# Patient Record
Sex: Female | Born: 1960 | Race: White | Hispanic: No | Smoking: Current every day smoker
Health system: Southern US, Community
[De-identification: ages and names within clinical notes are randomized; demographics above are authoritative.]

## PROBLEM LIST (undated history)

## (undated) DIAGNOSIS — R519 Headache, unspecified: Secondary | ICD-10-CM

## (undated) DIAGNOSIS — F419 Anxiety disorder, unspecified: Secondary | ICD-10-CM

## (undated) DIAGNOSIS — J90 Pleural effusion, not elsewhere classified: Secondary | ICD-10-CM

## (undated) DIAGNOSIS — K5790 Diverticulosis of intestine, part unspecified, without perforation or abscess without bleeding: Secondary | ICD-10-CM

## (undated) DIAGNOSIS — K219 Gastro-esophageal reflux disease without esophagitis: Secondary | ICD-10-CM

## (undated) DIAGNOSIS — E785 Hyperlipidemia, unspecified: Secondary | ICD-10-CM

## (undated) DIAGNOSIS — D649 Anemia, unspecified: Secondary | ICD-10-CM

## (undated) DIAGNOSIS — I517 Cardiomegaly: Secondary | ICD-10-CM

## (undated) DIAGNOSIS — R51 Headache: Secondary | ICD-10-CM

## (undated) DIAGNOSIS — K648 Other hemorrhoids: Secondary | ICD-10-CM

## (undated) HISTORY — DX: Diverticulosis of intestine, part unspecified, without perforation or abscess without bleeding: K57.90

## (undated) HISTORY — DX: Other hemorrhoids: K64.8

## (undated) HISTORY — DX: Cardiomegaly: I51.7

## (undated) HISTORY — DX: Anxiety disorder, unspecified: F41.9

## (undated) HISTORY — DX: Headache: R51

## (undated) HISTORY — DX: Gastro-esophageal reflux disease without esophagitis: K21.9

## (undated) HISTORY — DX: Hyperlipidemia, unspecified: E78.5

## (undated) HISTORY — PX: TONSILLECTOMY: SUR1361

## (undated) HISTORY — PX: COLONOSCOPY W/ POLYPECTOMY: SHX1380

## (undated) HISTORY — DX: Headache, unspecified: R51.9

## (undated) HISTORY — DX: Anemia, unspecified: D64.9

## (undated) HISTORY — DX: Pleural effusion, not elsewhere classified: J90

---

## 2018-01-03 ENCOUNTER — Encounter: Payer: Self-pay | Admitting: *Deleted

## 2018-01-03 DIAGNOSIS — I517 Cardiomegaly: Secondary | ICD-10-CM | POA: Insufficient documentation

## 2018-01-03 DIAGNOSIS — F419 Anxiety disorder, unspecified: Secondary | ICD-10-CM | POA: Insufficient documentation

## 2018-01-03 DIAGNOSIS — J90 Pleural effusion, not elsewhere classified: Secondary | ICD-10-CM

## 2018-01-03 HISTORY — DX: Pleural effusion, not elsewhere classified: J90

## 2018-01-03 HISTORY — DX: Cardiomegaly: I51.7

## 2018-01-06 ENCOUNTER — Encounter: Payer: Self-pay | Admitting: Cardiology

## 2018-01-06 ENCOUNTER — Ambulatory Visit (INDEPENDENT_AMBULATORY_CARE_PROVIDER_SITE_OTHER): Payer: BLUE CROSS/BLUE SHIELD | Admitting: Cardiology

## 2018-01-06 VITALS — BP 128/80 | HR 84 | Ht 65.0 in | Wt 172.1 lb

## 2018-01-06 DIAGNOSIS — F419 Anxiety disorder, unspecified: Secondary | ICD-10-CM | POA: Diagnosis not present

## 2018-01-06 DIAGNOSIS — F17210 Nicotine dependence, cigarettes, uncomplicated: Secondary | ICD-10-CM

## 2018-01-06 DIAGNOSIS — I517 Cardiomegaly: Secondary | ICD-10-CM

## 2018-01-06 DIAGNOSIS — R079 Chest pain, unspecified: Secondary | ICD-10-CM | POA: Insufficient documentation

## 2018-01-06 HISTORY — DX: Nicotine dependence, cigarettes, uncomplicated: F17.210

## 2018-01-06 HISTORY — DX: Chest pain, unspecified: R07.9

## 2018-01-06 NOTE — Addendum Note (Signed)
Addended by: Roosvelt HarpsHARRIS, Ashlin Hidalgo R on: 01/06/2018 10:54 AM   Modules accepted: Orders

## 2018-01-06 NOTE — Progress Notes (Signed)
Cardiology Office Note:    Date:  01/06/2018   ID:  Lydia Mitchell, DOB 01/28/1961, MRN [id number]  PCP:  Nonnie DoneSlatosky, Lydia J., MD  Cardiologist:  Garwin Brothersajan R Revankar, MD   Referring MD: Nonnie DoneSlatosky, Lydia J., MD    ASSESSMENT:    1. Chest pain, unspecified type   2. Anxiety   3. Cardiomegaly   4. Cigarette smoker    PLAN:    In order of problems listed above:  1. I discussed my findings with the patient at extensive length.  Primary prevention stressed.  Importance of compliance with diet and medications stressed and she vocalized understanding. 2. I spent 5 minutes with the patient discussing solely about smoking. Smoking cessation was counseled. I suggested to the patient also different medications and pharmacological interventions. Patient is keen to try stopping on its own at this time. He will get back to me if he needs any further assistance in this matter. 3. She does not clinically or from the EKG standpoint have evidence of pericarditis.  Her clinical exam did not reveal any pericardial rub or pleural rub.  In view of this I would not do any active intervention at this time from a cardiology standpoint. 4. Echocardiogram will be done to assess the extent of pericardial effusion.  She will undergo exercise stress Cardiolite to assess her chest pain symptoms which are atypical.  This is from a coronary standpoint. 5. Patient will be seen in follow-up appointment in 6 months or earlier if the patient has any concerns    Medication Adjustments/Labs and Tests Ordered: Current medicines are reviewed at length with the patient today.  Concerns regarding medicines are outlined above.  Orders Placed This Encounter  Procedures  . MYOCARDIAL PERFUSION IMAGING  . ECHOCARDIOGRAM COMPLETE   No orders of the defined types were placed in this encounter.    History of Present Illness:    Lydia Mitchell is a 57 y.o. female who is being seen today for the evaluation of pericardial effusion and  chest pain at the request of Lydia Mitchell, Excell SeltzerJohn J., MD.  Patient is a pleasant 57 year old female.  She has no significant past medical history.  She denies any history of upper respiratory tract infection.  She mentions to me that she had chest discomfort symptoms and went to her primary care doctor she was found to have a pleural and pericardial effusion.  These were tiny effusions.  She was sent here for that evaluation.  She tells me that she works at [employer].  She is walking around all day long with this she has no chest pain or chest tightness.  Deep breath or with changing of posture does not elicit her chest symptoms.  At the time of my evaluation, the patient is alert awake oriented and in no distress.  Past Medical History:  Diagnosis Date  . Acid reflux   . Anemia   . Anxiety   . Cardiomegaly 01/03/2018  . Cephalgia    Chronic recurrent  . Diverticulosis   . Internal hemorrhoids   . Pleural effusion 01/03/2018    Past Surgical History:  Procedure Laterality Date  . COLONOSCOPY W/ POLYPECTOMY    . TONSILLECTOMY      Current Medications: Current Meds  Medication Sig  . acetaminophen (TYLENOL) 325 MG tablet Take 2 tablets by mouth 3 (three) times daily.  Marland Kitchen. ALPRAZolam (XANAX) 0.25 MG tablet Take 1 tablet by mouth as needed.   Marland Kitchen. levofloxacin (LEVAQUIN) 500 MG tablet Take 1 tablet by  mouth daily.  . predniSONE (DELTASONE) 20 MG tablet Take 20 mg by mouth daily. 60/50/40/30/20/10 orally for each dosage     Allergies:   Patient has no known allergies.   Social History   Socioeconomic History  . Marital status: Unknown    Spouse name: Not on file  . Number of children: Not on file  . Years of education: Not on file  . Highest education level: Not on file  Occupational History  . Not on file  Social Needs  . Financial resource strain: Not on file  . Food insecurity:    Worry: Not on file    Inability: Not on file  . Transportation needs:    Medical: Not on file     Non-medical: Not on file  Tobacco Use  . Smoking status: Current Every Day Smoker  . Smokeless tobacco: Never Used  Substance and Sexual Activity  . Alcohol use: Never    Frequency: Never  . Drug use: Never  . Sexual activity: Not on file  Lifestyle  . Physical activity:    Days per week: Not on file    Minutes per session: Not on file  . Stress: Not on file  Relationships  . Social connections:    Talks on phone: Not on file    Gets together: Not on file    Attends religious service: Not on file    Active member of club or organization: Not on file    Attends meetings of clubs or organizations: Not on file    Relationship status: Not on file  Other Topics Concern  . Not on file  Social History Narrative  . Not on file     Family History: The patient's family history is not on file.  ROS:   Please see the history of present illness.    All other systems reviewed and are negative.  EKGs/Labs/Other Studies Reviewed:    The following studies were reviewed today: EKG done and sent to me revealed sinus rhythm and nonspecific ST-T changes.   Recent Labs: No results found for requested labs within last 8760 hours.  Recent Lipid Panel No results found for: CHOL, TRIG, HDL, CHOLHDL, VLDL, LDLCALC, LDLDIRECT  Physical Exam:    VS:  BP 128/80 (BP Location: Right Arm, Patient Position: Sitting, Cuff Size: Normal)   Pulse 84   Ht 5\' 5"  (1.651 m)   Wt 172 lb 1.9 oz (78.1 kg)   SpO2 98%   BMI 28.64 kg/m     Wt Readings from Last 3 Encounters:  01/06/18 172 lb 1.9 oz (78.1 kg)     GEN: Patient is in no acute distress HEENT: Normal NECK: No JVD; No carotid bruits LYMPHATICS: No lymphadenopathy CARDIAC: S1 S2 regular, 2/6 systolic murmur at the apex. RESPIRATORY:  Clear to auscultation without rales, wheezing or rhonchi  ABDOMEN: Soft, non-tender, non-distended MUSCULOSKELETAL:  No edema; No deformity  SKIN: Warm and dry NEUROLOGIC:  Alert and oriented x  3 PSYCHIATRIC:  Normal affect    Signed, Garwin Brothers, MD  01/06/2018 10:06 AM    [facility]

## 2018-01-06 NOTE — Patient Instructions (Signed)
Medication Instructions:  Your physician recommends that you continue on your current medications as directed. Please refer to the Current Medication list given to you today.  Labwork: None  Testing/Procedures: Your physician has requested that you have an echocardiogram. Echocardiography is a painless test that uses sound waves to create images of your heart. It provides your doctor with information about the size and shape of your heart and how well your heart's chambers and valves are working. This procedure takes approximately one hour. There are no restrictions for this procedure.  Your physician has requested that you have en exercise stress myoview. For further information please visit [email or web address]. Please follow instruction sheet, as given.  Follow-Up: Your physician recommends that you schedule a follow-up appointment in: 6 months  Any Other Special Instructions Will Be Listed Below (If Applicable).     If you need a refill on your cardiac medications before your next appointment, please call your pharmacy.   [facility]  Garey HamAshley A, RN, BSN  Echocardiogram An echocardiogram, or echocardiography, uses sound waves (ultrasound) to produce an image of your heart. The echocardiogram is simple, painless, obtained within a short period of time, and offers valuable information to your health care provider. The images from an echocardiogram can provide information such as:  Evidence of coronary artery disease (CAD).  Heart size.  Heart muscle function.  Heart valve function.  Aneurysm detection.  Evidence of a past heart attack.  Fluid buildup around the heart.  Heart muscle thickening.  Assess heart valve function.  Tell a health care provider about:  Any allergies you have.  All medicines you are taking, including vitamins, herbs, eye drops, creams, and over-the-counter medicines.  Any problems you or family members have had with anesthetic  medicines.  Any blood disorders you have.  Any surgeries you have had.  Any medical conditions you have.  Whether you are pregnant or may be pregnant. What happens before the procedure? No special preparation is needed. Eat and drink normally. What happens during the procedure?  In order to produce an image of your heart, gel will be applied to your chest and a wand-like tool (transducer) will be moved over your chest. The gel will help transmit the sound waves from the transducer. The sound waves will harmlessly bounce off your heart to allow the heart images to be captured in real-time motion. These images will then be recorded.  You may need an IV to receive a medicine that improves the quality of the pictures. What happens after the procedure? You may return to your normal schedule including diet, activities, and medicines, unless your health care provider tells you otherwise. This information is not intended to replace advice given to you by your health care provider. Make sure you discuss any questions you have with your health care provider. Document Released: 09/18/2000 Document Revised: 05/09/2016 Document Reviewed: 05/29/2013 Elsevier Interactive Patient Education  2017 [employer]

## 2018-01-07 ENCOUNTER — Ambulatory Visit (INDEPENDENT_AMBULATORY_CARE_PROVIDER_SITE_OTHER): Payer: BLUE CROSS/BLUE SHIELD | Admitting: Internal Medicine

## 2018-01-07 ENCOUNTER — Encounter: Payer: Self-pay | Admitting: Internal Medicine

## 2018-01-07 ENCOUNTER — Other Ambulatory Visit (INDEPENDENT_AMBULATORY_CARE_PROVIDER_SITE_OTHER): Payer: BLUE CROSS/BLUE SHIELD

## 2018-01-07 ENCOUNTER — Ambulatory Visit (INDEPENDENT_AMBULATORY_CARE_PROVIDER_SITE_OTHER)
Admission: RE | Admit: 2018-01-07 | Discharge: 2018-01-07 | Disposition: A | Discharge status: Home / Self Care | Payer: BLUE CROSS/BLUE SHIELD | Source: Ambulatory Visit | Attending: Internal Medicine | Admitting: Internal Medicine

## 2018-01-07 VITALS — BP 112/74 | HR 72 | Ht 65.0 in | Wt 168.6 lb

## 2018-01-07 DIAGNOSIS — R0609 Other forms of dyspnea: Secondary | ICD-10-CM

## 2018-01-07 DIAGNOSIS — F17210 Nicotine dependence, cigarettes, uncomplicated: Secondary | ICD-10-CM | POA: Diagnosis not present

## 2018-01-07 DIAGNOSIS — R0600 Dyspnea, unspecified: Secondary | ICD-10-CM

## 2018-01-07 DIAGNOSIS — J90 Pleural effusion, not elsewhere classified: Secondary | ICD-10-CM

## 2018-01-07 HISTORY — DX: Other forms of dyspnea: R06.09

## 2018-01-07 HISTORY — DX: Dyspnea, unspecified: R06.00

## 2018-01-07 LAB — CBC WITH DIFFERENTIAL/PLATELET
Basophils Absolute: 0.1 K/uL (ref 0.0–0.1)
Basophils Relative: 1 % (ref 0.0–3.0)
EOS ABS: 0.1 K/uL (ref 0.0–0.7)
EOS PCT: 0.7 % (ref 0.0–5.0)
HCT: 40.4 % (ref 36.0–46.0)
HEMOGLOBIN: 13.6 g/dL (ref 12.0–15.0)
LYMPHS ABS: 3.2 K/uL (ref 0.7–4.0)
Lymphocytes Relative: 22.1 % (ref 12.0–46.0)
MCHC: 33.7 g/dL (ref 30.0–36.0)
MCV: 94.3 fl (ref 78.0–100.0)
MONO ABS: 0.8 K/uL (ref 0.1–1.0)
Monocytes Relative: 5.6 % (ref 3.0–12.0)
NEUTROS PCT: 70.6 % (ref 43.0–77.0)
Neutro Abs: 10.2 K/uL — ABNORMAL HIGH (ref 1.4–7.7)
Platelets: 606 K/uL — ABNORMAL HIGH (ref 150.0–400.0)
RBC: 4.28 Mil/uL (ref 3.87–5.11)
RDW: 13.1 % (ref 11.5–15.5)
WBC: 14.5 K/uL — AB (ref 4.0–10.5)

## 2018-01-07 LAB — SEDIMENTATION RATE: SED RATE: 113 mm/hr — AB (ref 0–30)

## 2018-01-07 NOTE — Patient Instructions (Addendum)
Please remember to go to the lab and x-ray department downstairs in the basement  for your tests - we will call you with the results when they are available and decide on follow up   We will call you a year from your prior CT to be sure you get the follow up on the nodule that was detected but it has nothing to do with your present symptoms or the fluid issue which is likely related to your diverticulitis  The key is to stop smoking completely before smoking completely stops you - it's not too late! - ADD:;  Esr 113 so needs to taper off pred and f/u in 2 weeks, sooner if needed for possible L thoracentesis

## 2018-01-07 NOTE — Progress Notes (Signed)
Subjective:     Patient ID: Lydia Mitchell, female   DOB: 04/10/1961,     MRN: [id number]  HPI   [age] yowf active smoker with tendency to rhinitis/sneezing springtime but around 2016 developed  tendency to "bronchitis" esp in winter then Jan 2019 developed abrupt onset  n and v and diffuse abd pain > UC eval > [facility] 11/04/17 > dx signmoid diverticulitis rx abx and some better but pain localized to R flank did have gen pain 10/10 and down 2/10 and n and V gone but f/u  CT 12/24/17 diverticulitis bettter but now  :  L > R small effusions and  More doe but no rest/noct sob x 2 pillows   01/07/2018 1st [facility] office visit/ Wert   Chief Complaint  Patient presents with  . Pulmonary Consult    Referred by Dr. Egbert GaribaldiSlatosky for eval of pleural effusion.  She c/o SOB "when I walk alot" for the past 2 months. She has occ CP and prod cough with clear sputum.    cp is R ant chest x sev sec x once day never supine/ better also with R flank supine in supine position Eating and drinking better/ min am cough with mucoid sputum and doe = MMRC2 = can't walk a nl pace on a flat grade s sob but does fine slow and flat and able to sleep on 1-2 pillows   rx levaquin and pred by pcp 12/31/17>  No sure made any difference/ no using inhalers   No obvious day to day or daytime variability or assoc excess/ purulent sputum or mucus plugs or hemoptysis or chest tightness, subjective wheeze or overt sinus or hb symptoms. No unusual exposure hx or h/o childhood pna/ asthma or knowledge of premature birth.  Sleeping ok  2 pillows  without nocturnal  or early am exacerbation  of respiratory  c/o's or need for noct saba. Also denies any obvious fluctuation of symptoms with weather or environmental changes or other aggravating or alleviating factors except as outlined above   Current Allergies, Complete Past Medical History, Past Surgical History, Family History, and Social History were reviewed in [facility]  electronic medical record.  ROS  The following are not active complaints unless bolded Hoarseness, sore throat, dysphagia, dental problems, itching, sneezing,  nasal congestion or discharge of excess mucus or purulent secretions, ear ache,   fever, chills, sweats, unintended wt loss or wt gain, classically pleuritic or exertional cp,  orthopnea pnd or leg swelling, presyncope, palpitations, abdominal pain, anorexia, nausea, vomiting, diarrhea  or change in bowel habits or change in bladder habits, change in stools or change in urine, dysuria, hematuria,  rash, arthralgias, visual complaints, headache, numbness, weakness or ataxia or problems with walking or coordination,  change in mood/affect or memory.        Current Meds  Medication Sig  . acetaminophen (TYLENOL) 325 MG tablet Take 2 tablets by mouth 3 (three) times daily.  Marland Kitchen. ALPRAZolam (XANAX) 0.25 MG tablet Take 1 tablet by mouth as needed.   Marland Kitchen. levofloxacin (LEVAQUIN) 500 MG tablet Take 1 tablet by mouth daily.  . predniSONE (DELTASONE) 20 MG tablet Take 20 mg by mouth daily. 60/50/40/30/20/10 orally for each dosage                  Review of Systems     Objective:   Physical Exam    somber amb wf nad   Wt Readings from Last 3 Encounters:  01/07/18 168  lb 9.6 oz (76.5 kg)  01/06/18 172 lb 1.9 oz (78.1 kg)     Vital signs reviewed - Note on arrival 02 sats  99% on RA      HEENT: nl dentition, turbinates bilaterally, and oropharynx. Nl external ear canals without cough reflex   NECK :  without JVD/Nodes/TM/ nl carotid upstrokes bilaterally   LUNGS: no acc muscle use,  Nl contour chest with minimal decrease bs L > R base and dullness    CV:  RRR  no s3 or murmur or increase in P2, and no edema   ABD:  Obese/ soft and nontender with nl inspiratory excursion in the supine position. No bruits or organomegaly appreciated, bowel sounds nl  MS:  Nl gait/ ext warm without deformities, calf tenderness, cyanosis or  clubbing No obvious joint restrictions   SKIN: warm and dry without lesions    NEURO:  alert, approp, nl sensorium with  no motor or cerebellar deficits apparent.       CXR PA and Lateral:   01/07/2018 :    I personally reviewed images and agree with radiology impression as follows:    small L effusion s miniscus sign on lateral so likely loculated       Lab Results  Component Value Date   ESRSEDRATE 113 (H) 01/07/2018     Lab Results  Component Value Date   WBC 14.5 (H) 01/07/2018   HGB 13.6 01/07/2018   HCT 40.4 01/07/2018   MCV 94.3 01/07/2018   PLT 606.0 (H) 01/07/2018     Assessment:

## 2018-01-08 ENCOUNTER — Encounter: Payer: Self-pay | Admitting: Internal Medicine

## 2018-01-08 NOTE — Assessment & Plan Note (Signed)
Spirometry 01/07/2018  FEV1 1.85 (68%)  Ratio 83 w/o curvature on no rx    Mostly restrictive prob related to body habitus and L effusion (see separate a/p)  No need for bronchodilators here but needs to stop smoking now (see separate a/p)

## 2018-01-08 NOTE — Assessment & Plan Note (Signed)
See CT chest 12/24/17 at [facility] - ESR  113 01/07/2018 on prednisone taper   Most likely this is a "sympathetic" effusion related to diverticulitis or parapneumonic process from vomiting/ asp injury from the initial acute abdominal issues but either way it is now loculated and may well not be drainable with standard thoracentesis. Also has very min pericardial fluid raising the possibility of periccarditis/ pleuritis from collagen vasc dz but this is much less likely  Since she appears to be doing overall well I rec taper off pred and if any worsening of effusion then attempt tap under u/s guidance.   Will need close f/u either way = 2 weeks    Total time devoted to counseling  > 50 % of initial 60 min office visit:  review case with pt/ discussion of options/alternatives/ personally creating written customized instructions  in presence of pt  then going over those specific  Instructions directly with the pt including how to use all of the meds but in particular covering each new medication in detail and the difference between the maintenance= "automatic" meds and the prns using an action plan format for the latter (If this problem/symptom => do that organization reading Left to right).  Please see AVS from this visit for a full list of these instructions which I personally wrote for this pt and  are unique to this visit.

## 2018-01-08 NOTE — Assessment & Plan Note (Signed)
No evidence at all of copd here ' > 3 min discussion I reviewed the Fletcher curve with the patient that basically indicates  if you quit smoking when your best day FEV1 is still well preserved (as is clearly  the case here)  it is highly unlikely you will progress to severe disease and informed the patient there was  no medication on the market that has proven to alter the curve/ its downward trajectory  or the likelihood of progression of their disease(unlike other chronic medical conditions such as atheroclerosis where we do think we can change the natural hx with risk reducing meds)    Therefore stopping smoking and maintaining abstinence are  the most important aspects of care, not choice of inhalers or for that matter, doctors.   Treatment other than smoking cessation  is entirely directed by severity of symptoms and focused also on reducing exacerbations, not attempting to change the natural history of the disease.

## 2018-01-10 NOTE — Progress Notes (Signed)
Spoke with pt and notified of results per Dr. Wert. Pt verbalized understanding and denied any questions. 

## 2018-01-12 DIAGNOSIS — R079 Chest pain, unspecified: Secondary | ICD-10-CM

## 2018-01-13 ENCOUNTER — Other Ambulatory Visit: Payer: Self-pay

## 2018-01-13 ENCOUNTER — Telehealth: Payer: Self-pay

## 2018-01-13 DIAGNOSIS — R079 Chest pain, unspecified: Secondary | ICD-10-CM

## 2018-01-13 DIAGNOSIS — I517 Cardiomegaly: Secondary | ICD-10-CM

## 2018-01-13 NOTE — Telephone Encounter (Signed)
Results from echo and lexi given to the patient. No further questions or concerns.

## 2018-01-19 ENCOUNTER — Ambulatory Visit (INDEPENDENT_AMBULATORY_CARE_PROVIDER_SITE_OTHER): Payer: BLUE CROSS/BLUE SHIELD | Admitting: Internal Medicine

## 2018-01-19 ENCOUNTER — Encounter: Payer: Self-pay | Admitting: Internal Medicine

## 2018-01-19 ENCOUNTER — Other Ambulatory Visit (INDEPENDENT_AMBULATORY_CARE_PROVIDER_SITE_OTHER): Payer: BLUE CROSS/BLUE SHIELD

## 2018-01-19 ENCOUNTER — Ambulatory Visit (INDEPENDENT_AMBULATORY_CARE_PROVIDER_SITE_OTHER)
Admission: RE | Admit: 2018-01-19 | Discharge: 2018-01-19 | Disposition: A | Discharge status: Home / Self Care | Payer: BLUE CROSS/BLUE SHIELD | Source: Ambulatory Visit | Attending: Internal Medicine | Admitting: Internal Medicine

## 2018-01-19 ENCOUNTER — Other Ambulatory Visit: Payer: Self-pay | Admitting: Internal Medicine

## 2018-01-19 VITALS — BP 104/64 | HR 83 | Ht 65.0 in | Wt 170.6 lb

## 2018-01-19 DIAGNOSIS — J90 Pleural effusion, not elsewhere classified: Secondary | ICD-10-CM

## 2018-01-19 DIAGNOSIS — R0609 Other forms of dyspnea: Secondary | ICD-10-CM | POA: Diagnosis not present

## 2018-01-19 DIAGNOSIS — F17210 Nicotine dependence, cigarettes, uncomplicated: Secondary | ICD-10-CM

## 2018-01-19 DIAGNOSIS — R911 Solitary pulmonary nodule: Secondary | ICD-10-CM

## 2018-01-19 HISTORY — DX: Solitary pulmonary nodule: R91.1

## 2018-01-19 LAB — CBC WITH DIFFERENTIAL/PLATELET
BASOS PCT: 0.4 % (ref 0.0–3.0)
Basophils Absolute: 0 K/uL (ref 0.0–0.1)
Eosinophils Absolute: 0.5 K/uL (ref 0.0–0.7)
Eosinophils Relative: 5 % (ref 0.0–5.0)
HEMATOCRIT: 42.1 % (ref 36.0–46.0)
Hemoglobin: 13.9 g/dL (ref 12.0–15.0)
LYMPHS ABS: 2.9 K/uL (ref 0.7–4.0)
LYMPHS PCT: 26.4 % (ref 12.0–46.0)
MCHC: 33.1 g/dL (ref 30.0–36.0)
MCV: 95.1 fl (ref 78.0–100.0)
Monocytes Absolute: 0.8 K/uL (ref 0.1–1.0)
Monocytes Relative: 7.7 % (ref 3.0–12.0)
NEUTROS ABS: 6.6 K/uL (ref 1.4–7.7)
NEUTROS PCT: 60.5 % (ref 43.0–77.0)
PLATELETS: 391 K/uL (ref 150.0–400.0)
RBC: 4.42 Mil/uL (ref 3.87–5.11)
RDW: 13.5 % (ref 11.5–15.5)
WBC: 11 K/uL — ABNORMAL HIGH (ref 4.0–10.5)

## 2018-01-19 LAB — SEDIMENTATION RATE: Sed Rate: 25 mm/hr (ref 0–30)

## 2018-01-19 NOTE — Patient Instructions (Signed)
Please remember to go to the lab department downstairs in the basement  for your tests - we will call you with the results when they are available.  The key is to stop smoking completely before smoking completely stops you!    Pulmonary follow up is as needed

## 2018-01-19 NOTE — Progress Notes (Signed)
Subjective:     Patient ID: Lydia Mitchell, female   DOB: 1961/02/03,     MRN: [id number]     Brief patient profile:  4 yowf active smoker with tendency to rhinitis/sneezing springtime but around 2016 developed  tendency to "bronchitis" esp in winter then Jan 2019 developed abrupt onset  n and v and diffuse abd pain > UC eval > [facility] 11/04/17 > dx sigmoid diverticulitis rx abx and some better but pain localized to R flank did have gen pain 10/10 and down 2/10 and n and V gone but f/u  CT 12/24/17 diverticulitis bettter but now  :  L > R small effusions and  More doe but no rest/noct sob x 2 pillows   01/07/2018 1st  Pulmonary office visit/ Amahd Morino   Chief Complaint  Patient presents with  . Pulmonary Consult    Referred by Dr. Wendie Agreste for eval of pleural effusion.  She c/o SOB "when I walk alot" for the past 2 months. She has occ CP and prod cough with clear sputum.    cp is R ant chest x sev sec x once day never supine/ better also with R flank supine in supine position Eating and drinking better/ min am cough with mucoid sputum and doe = MMRC2 = can't walk a nl pace on a flat grade s sob but does fine slow and flat and able to sleep on 1-2 pillows  rx levaquin and pred by pcp 12/31/17>  No sure made any difference/ no using inhalers rec Please remember to go to the lab and x-ray department downstairs in the basement  for your tests - we will call you with the results when they are available and decide on follow up We will call you a year from your prior CT to be sure you get the follow up on the nodule that was detected but it has nothing to do with your present symptoms or the fluid issue which is likely related to your diverticulitis The key is to stop smoking completely before smoking completely stops you - it's not too late! - ADD:;  Esr 113 so needs to taper off pred and f/u in 2 weeks, sooner if needed for possible L thoracentesis     01/19/2018  f/u ov/Dak Szumski re:  Chief  Complaint  Patient presents with  . Follow-up    Breathing is unchanged.   Dyspnea:  MMRC1 = can walk nl pace, flat grade, can't hurry or go uphills or steps s sob   Cough: none Sleep: ok/ pains better supine  SABA use:  None New flank pain on L just like use to have on R, better supine, not worse with breathing or coughing / assoc with constipation and bloating   No obvious day to day or daytime variability or assoc excess/ purulent sputum or mucus plugs or hemoptysis or cp or chest tightness, subjective wheeze or overt sinus or hb symptoms. No unusual exposure hx or h/o childhood pna/ asthma or knowledge of premature birth.  Sleeping  Ok   without nocturnal  or early am exacerbation  of respiratory  c/o's or need for noct saba. Also denies any obvious fluctuation of symptoms with weather or environmental changes or other aggravating or alleviating factors except as outlined above   Current Allergies, Complete Past Medical History, Past Surgical History, Family History, and Social History were reviewed in [employer] record.  ROS  The following are not active complaints unless bolded Hoarseness, sore throat,  dysphagia, dental problems, itching, sneezing,  nasal congestion or discharge of excess mucus or purulent secretions, ear ache,   fever, chills, sweats, unintended wt loss or wt gain, classically pleuritic or exertional cp,  orthopnea pnd or arm/hand swelling  or leg swelling, presyncope, palpitations, abdominal pain, anorexia, nausea, vomiting, diarrhea  or change in bowel habits or change in bladder habits, change in stools or change in urine, dysuria, hematuria,  rash, arthralgias, visual complaints, headache, numbness, weakness or ataxia or problems with walking or coordination,  change in mood or  memory.        Current Meds  Medication Sig  . acetaminophen (TYLENOL) 325 MG tablet Take 2 tablets by mouth 3 (three) times daily.  Marland Kitchen ALPRAZolam (XANAX) 0.25 MG  tablet Take 1 tablet by mouth as needed.                  Objective:   Physical Exam  amb wf nad     01/19/2018      170  01/07/18 168 lb 9.6 oz (76.5 kg)  01/06/18 172 lb 1.9 oz (78.1 kg)      Vital signs reviewed - Note on arrival 02 sats  98% on RA    HEENT: nl dentition, turbinates bilaterally, and oropharynx. Nl external ear canals without cough reflex   NECK :  without JVD/Nodes/TM/ nl carotid upstrokes bilaterally   LUNGS: no acc muscle use,  Nl contour chest which is clear to A and P bilaterally without cough on insp or exp maneuvers   CV:  RRR  no s3 or murmur or increase in P2, and no edema   ABD: mod obese  soft and nontender with limited inspiratory excursion in the supine position. No bruits or organomegaly appreciated, bowel sounds nl  MS:  Nl gait/ ext warm without deformities, calf tenderness, cyanosis or clubbing No obvious joint restrictions   SKIN: warm and dry without lesions    NEURO:  alert, approp, nl sensorium with  no motor or cerebellar deficits apparent.        CXR PA and Lateral:   01/19/2018 :    I personally reviewed images and agree with radiology impression as follows:   Resolution of previous left pleural effusion. No active lung disease.        Assessment:

## 2018-01-19 NOTE — Assessment & Plan Note (Signed)
Spirometry 01/07/2018  FEV1 1.85 (68%)  Ratio 83 w/o curvature on no rx   Improved s specific rx , likely mild restrictive changes due to obesity with also significant deconditioning   No pulmonary f/u needed for this problem

## 2018-01-19 NOTE — Assessment & Plan Note (Signed)
>   3 min   I took an extended  opportunity with this patient to outline the consequences of continued cigarette use  in airway disorders based on all the data we have from the multiple national lung health studies (perfomed over decades at millions of dollars in cost)  indicating that smoking cessation, not choice of inhalers or physicians, is the most important aspect of care.    Already cut down to 4 cigs per day > advised complete smoking cessation is the goal > Follow up per Primary Care planned

## 2018-01-19 NOTE — Assessment & Plan Note (Signed)
CT abd 12/28/17 c/w 3 mm RML nodule > in computer for recall 12/29/18   CT results reviewed with pt >>> Too small for PET or bx, not suspicious enough for excisional bx > really only option for now is follow the Fleischner society guidelines as rec by radiology.   Discussed in detail all the  indications, usual  risks and alternatives  relative to the benefits with patient who agrees to proceed with conservative f/u as outlined = f/u in 12/2018 with ct chest if not done in [place]

## 2018-01-19 NOTE — Assessment & Plan Note (Signed)
Improved c/w sympathetic effusion related to ? abd infection/ diverticulitis > no f/u needed

## 2018-01-20 NOTE — Progress Notes (Signed)
LMTCB

## 2018-03-17 ENCOUNTER — Encounter: Payer: Self-pay | Admitting: Internal Medicine

## 2018-05-20 IMAGING — DX DG CHEST 2V
2 series · 2 of 2 positions shown · non-contrast
Comparison: None.

CLINICAL DATA: Dyspnea on exertion. History of cardiomegaly and
pleural effusion.

EXAM:
CHEST - 2 VIEW

[chest pa]
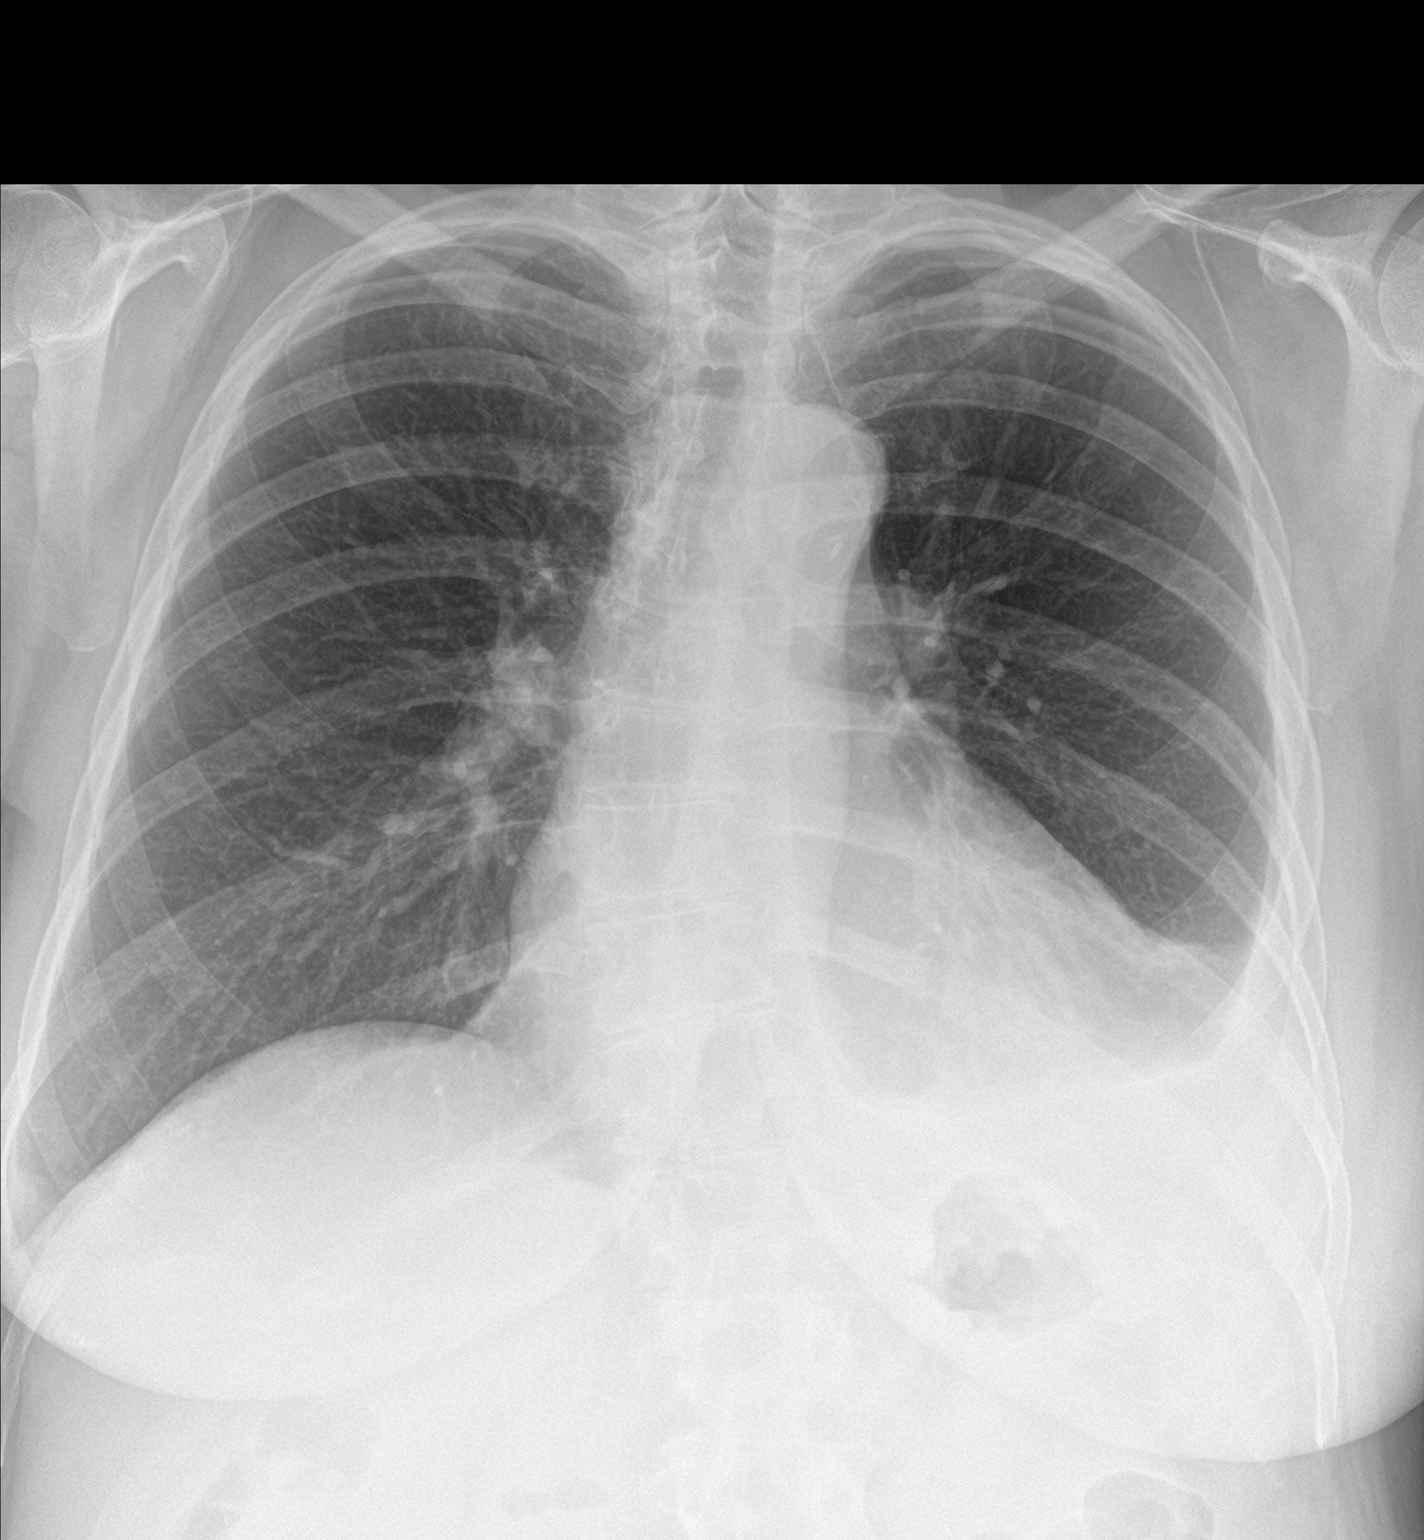

[chest lat]
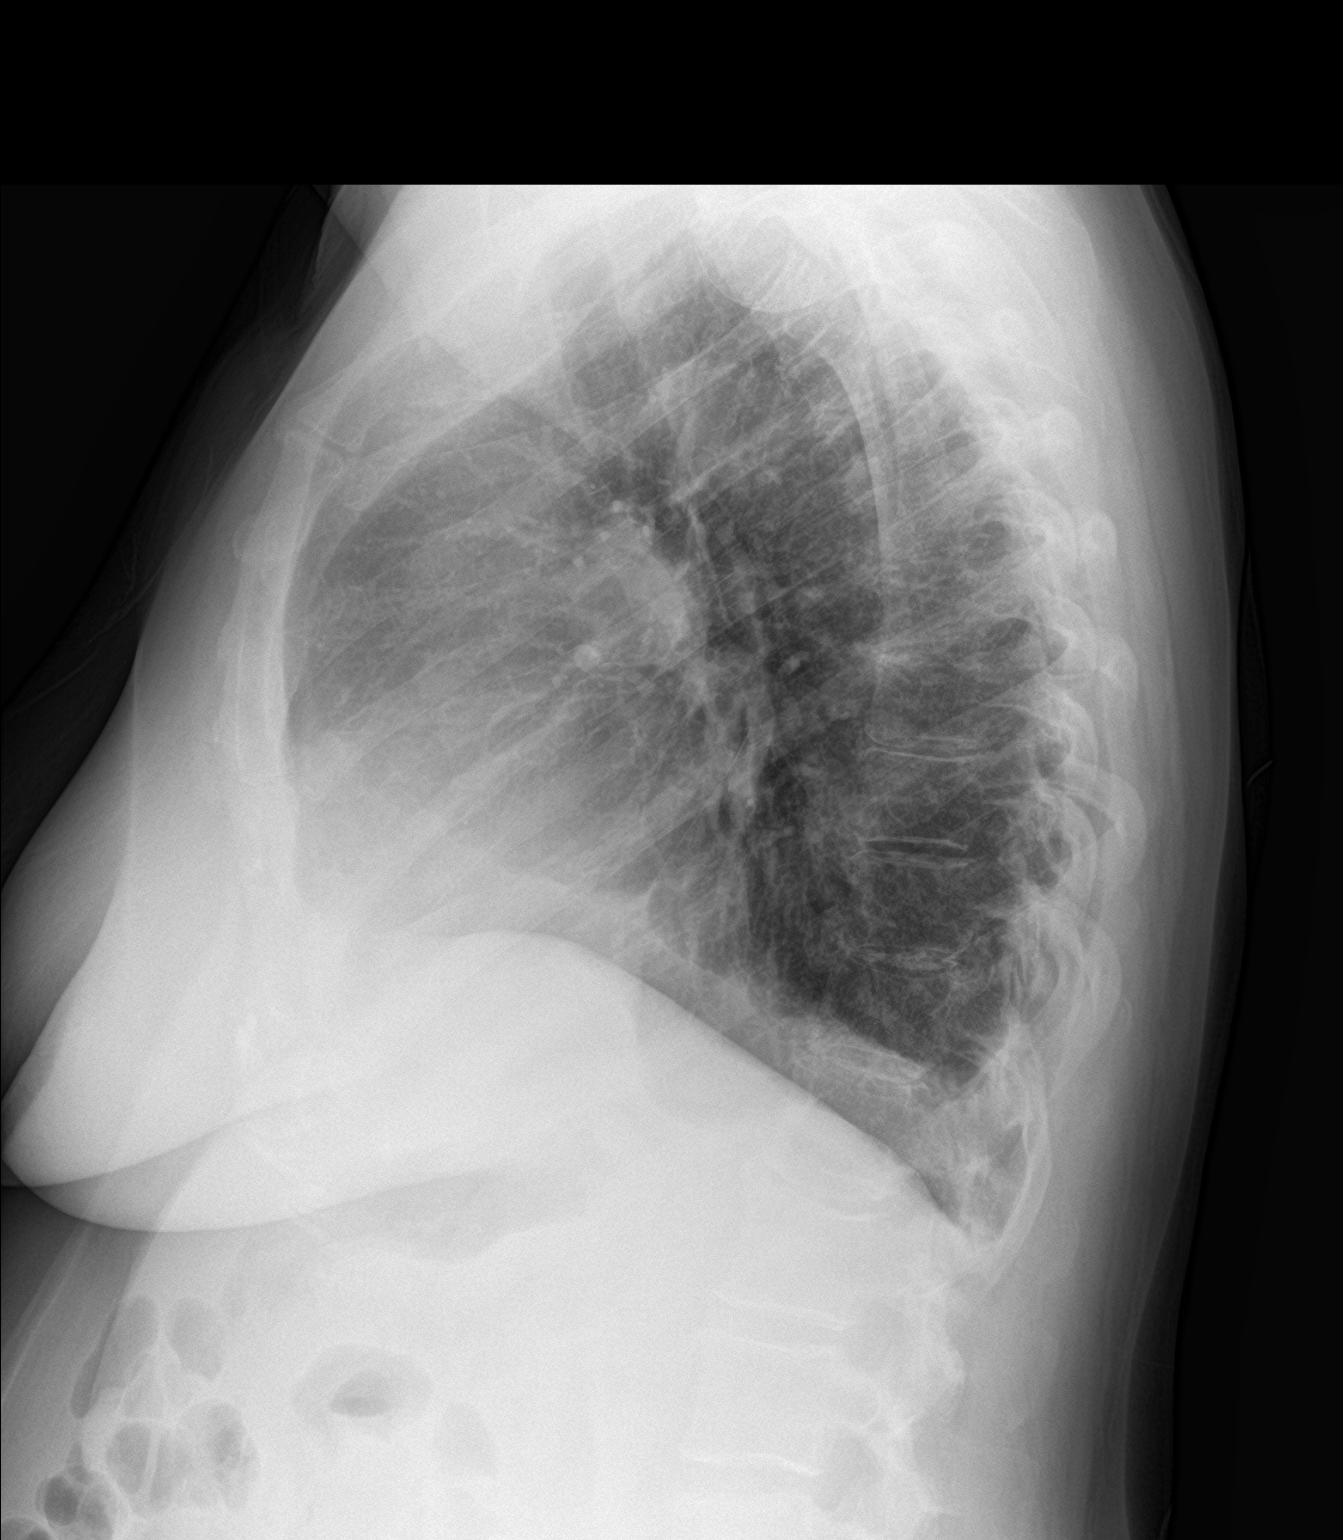

[2 of 2 positions shown; findings below may reference images not displayed]

FINDINGS: There is a small left pleural effusion with underlying opacity. A
tiny right effusion is identified on the lateral view. No
pneumothorax. The heart size is borderline to mildly enlarged.
Otherwise, the heart, hila, and mediastinum are unremarkable. No
other acute abnormalities.
IMPRESSION: Small left pleural effusion with underlying atelectasis.

## 2018-06-01 IMAGING — DX DG CHEST 2V
2 series · 2 of 2 positions shown · non-contrast
Comparison: Chest x-ray of 01/07/2018

CLINICAL DATA: History of left pleural effusion, follow-up,
persistent cough, smoking history

EXAM:
CHEST - 2 VIEW

[chest pa]
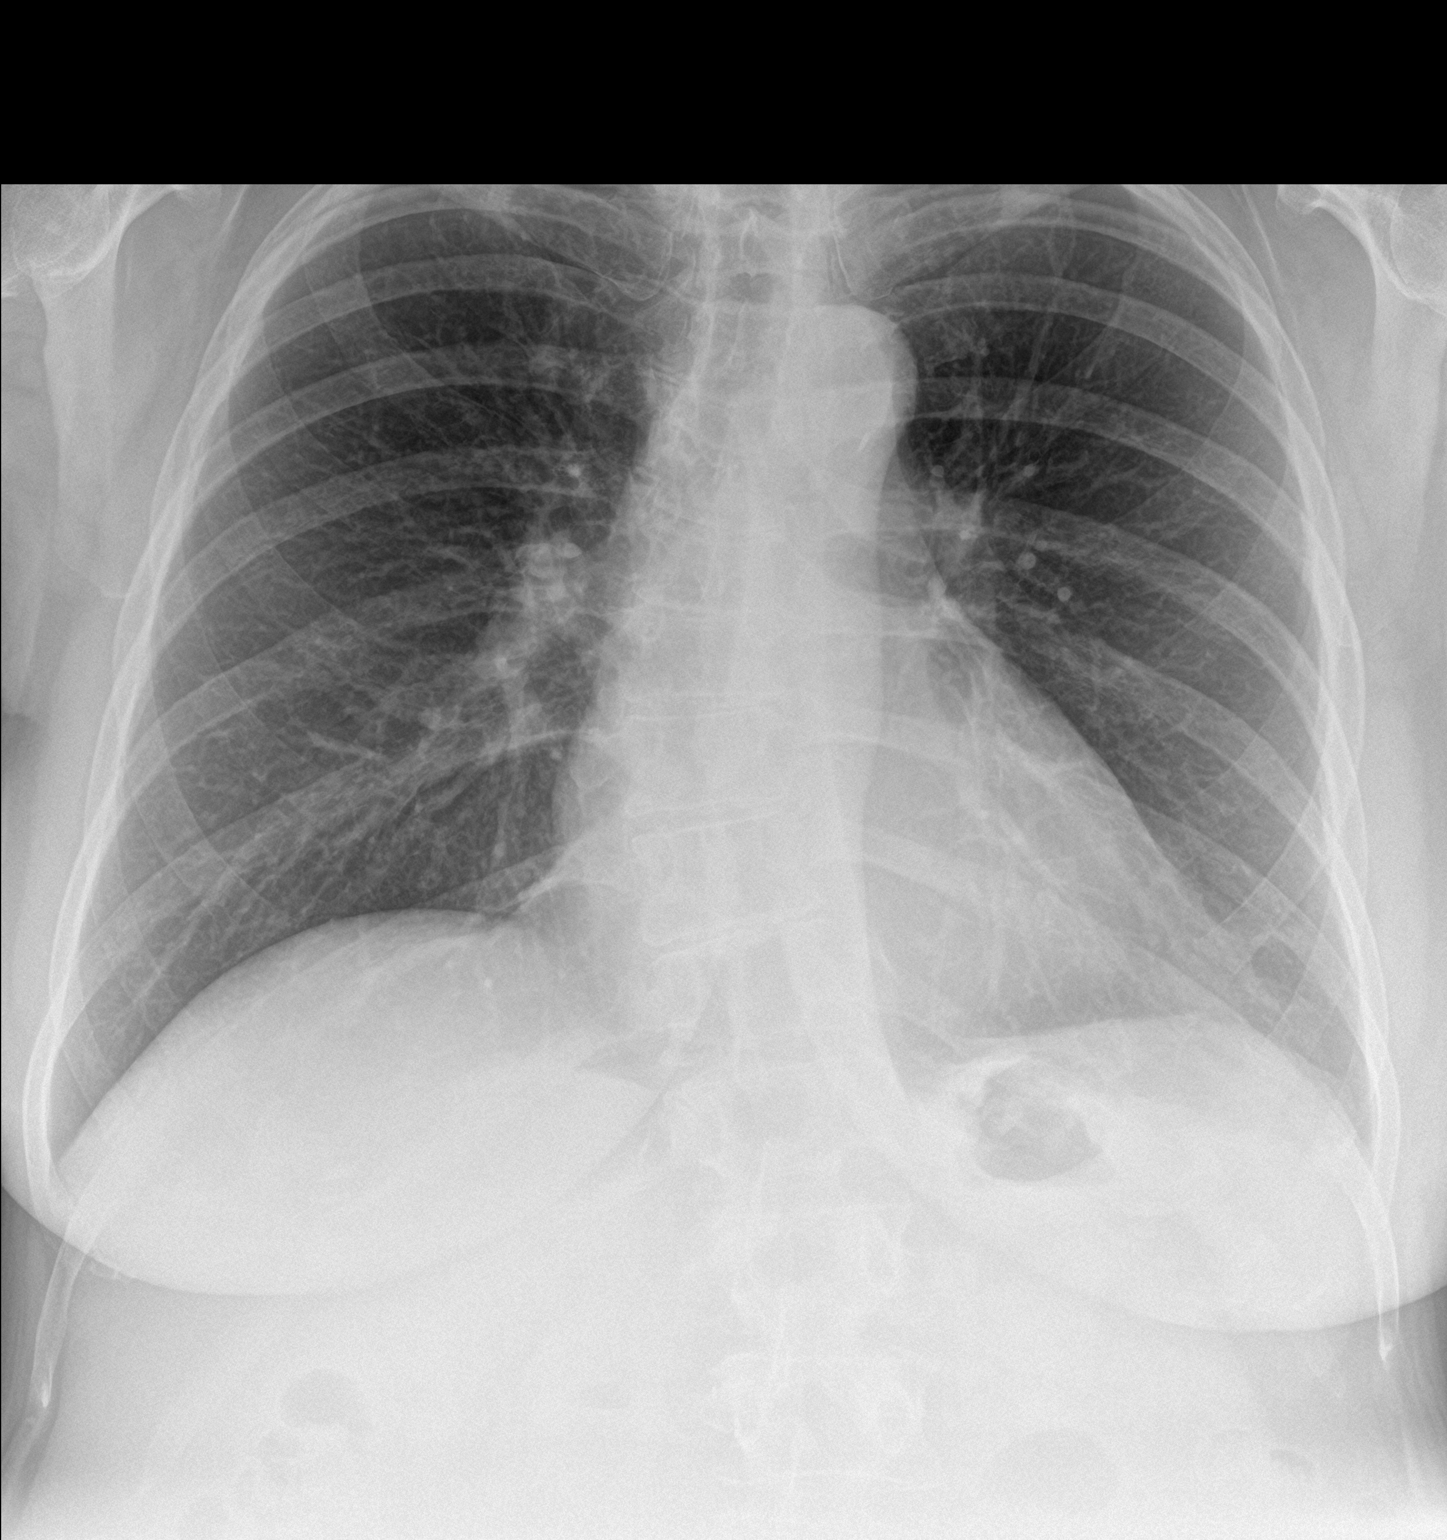

[chest lat]
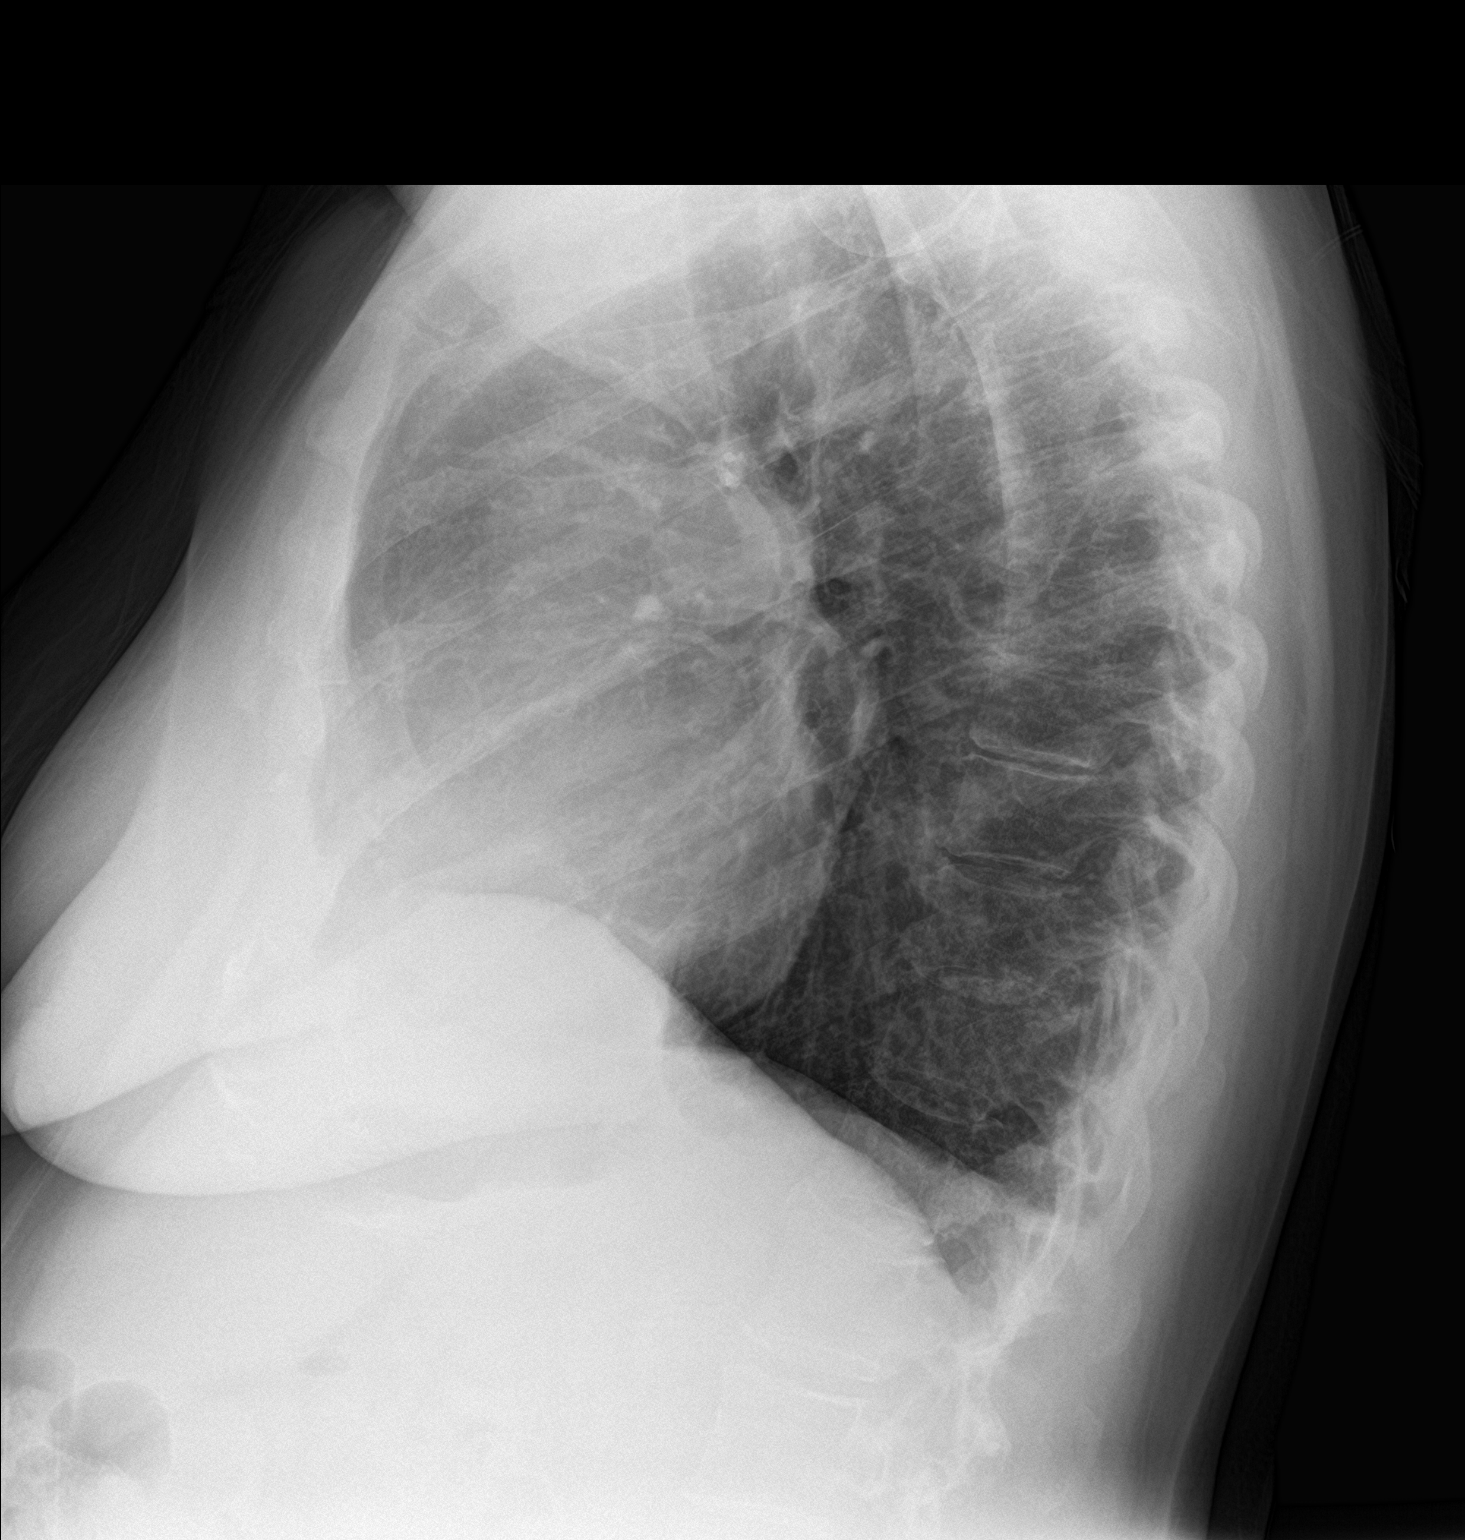

[2 of 2 positions shown; findings below may reference images not displayed]

FINDINGS: The left pleural effusion has resolved. No pneumonia is seen.
Mediastinal and hilar contours are unremarkable. The heart is mildly
enlarged and stable. No bony abnormality is seen.
IMPRESSION: Resolution of previous left pleural effusion. No active lung
disease.

## 2018-11-02 ENCOUNTER — Telehealth: Payer: Self-pay | Admitting: *Deleted

## 2018-11-02 NOTE — Telephone Encounter (Signed)
LMTCB  Details Code: R91.1 Noted: 01/19/2018 Share w/ Pt: [x]  Change Dx Resolve      Overview Edited: Nyoka Cowden, MD 01/19/2018 CT abd 12/28/17 c/w 3 mm RML nodule > in computer for recall 12/29/18

## 2018-11-02 NOTE — Telephone Encounter (Signed)
-----   Message from Nyoka Cowden, MD sent at 01/19/2018  1:51 PM EDT ----- Be sure has had ct s contrast f/u spn

## 2018-11-04 NOTE — Telephone Encounter (Signed)
LMTCB

## 2018-11-08 NOTE — Telephone Encounter (Signed)
Left message for patient to call back  

## 2018-11-10 ENCOUNTER — Encounter: Payer: Self-pay | Admitting: *Deleted

## 2018-11-10 NOTE — Telephone Encounter (Signed)
Letter mailed

## 2019-03-19 DIAGNOSIS — I340 Nonrheumatic mitral (valve) insufficiency: Secondary | ICD-10-CM | POA: Diagnosis not present

## 2019-09-08 ENCOUNTER — Other Ambulatory Visit: Payer: Self-pay | Admitting: Orthopedic Surgery

## 2019-09-08 DIAGNOSIS — M25561 Pain in right knee: Secondary | ICD-10-CM

## 2020-06-07 DIAGNOSIS — K219 Gastro-esophageal reflux disease without esophagitis: Secondary | ICD-10-CM | POA: Insufficient documentation

## 2020-06-07 DIAGNOSIS — D649 Anemia, unspecified: Secondary | ICD-10-CM | POA: Insufficient documentation

## 2020-06-07 DIAGNOSIS — F419 Anxiety disorder, unspecified: Secondary | ICD-10-CM | POA: Insufficient documentation

## 2020-06-07 DIAGNOSIS — K5790 Diverticulosis of intestine, part unspecified, without perforation or abscess without bleeding: Secondary | ICD-10-CM | POA: Insufficient documentation

## 2020-06-07 DIAGNOSIS — K648 Other hemorrhoids: Secondary | ICD-10-CM | POA: Insufficient documentation

## 2020-06-07 DIAGNOSIS — R519 Headache, unspecified: Secondary | ICD-10-CM | POA: Insufficient documentation

## 2020-06-11 ENCOUNTER — Encounter: Payer: Self-pay | Admitting: Cardiology

## 2020-06-12 ENCOUNTER — Encounter: Payer: Self-pay | Admitting: Cardiology

## 2020-06-12 ENCOUNTER — Other Ambulatory Visit: Payer: Self-pay

## 2020-06-12 ENCOUNTER — Ambulatory Visit: Payer: BC Managed Care – PPO | Admitting: Cardiology

## 2020-06-12 VITALS — BP 118/78 | HR 68 | Ht 65.0 in | Wt 189.4 lb

## 2020-06-12 DIAGNOSIS — F17210 Nicotine dependence, cigarettes, uncomplicated: Secondary | ICD-10-CM

## 2020-06-12 DIAGNOSIS — R0789 Other chest pain: Secondary | ICD-10-CM

## 2020-06-12 DIAGNOSIS — I10 Essential (primary) hypertension: Secondary | ICD-10-CM

## 2020-06-12 DIAGNOSIS — R0609 Other forms of dyspnea: Secondary | ICD-10-CM

## 2020-06-12 DIAGNOSIS — E782 Mixed hyperlipidemia: Secondary | ICD-10-CM

## 2020-06-12 DIAGNOSIS — R0600 Dyspnea, unspecified: Secondary | ICD-10-CM

## 2020-06-12 HISTORY — DX: Mixed hyperlipidemia: E78.2

## 2020-06-12 HISTORY — DX: Essential (primary) hypertension: I10

## 2020-06-12 HISTORY — DX: Other chest pain: R07.89

## 2020-06-12 MED ORDER — NITROGLYCERIN 0.4 MG SL SUBL
0.4000 mg | SUBLINGUAL | 3 refills | Status: AC | PRN
Start: 2020-06-12 — End: 2020-09-10

## 2020-06-12 NOTE — Patient Instructions (Signed)
Medication Instructions:  Your physician has recommended you make the following change in your medication:   1.    If a single episode of chest pain is not relieved by one tablet, the patient will try another within 5 minutes; and if this doesn't relieve the pain, the patient will try another within 5 minutes and if this doesn't relieve the pain the patient is instructed to call 911 for transportation to an emergency department. This was sent into your requested pharmacy.     *If you need a refill on your cardiac medications before your next appointment, please call your pharmacy*   Lab Work: None If you have labs (blood work) drawn today and your tests are completely normal, you will receive your results only by: Marland Kitchen MyChart Message (if you have MyChart) OR . A paper copy in the mail If you have any lab test that is abnormal or we need to change your treatment, we will call you to review the results.   Testing/Procedures: Your physician has requested that you have a lexiscan myoview. For further information please visit [email or web address] Please follow instruction sheet, as given.  You are scheduled for a Myocardial Perfusion Imaging Study on       at         .   Please arrive 15 minutes prior to your appointment time for registration and insurance purposes.   The test will take approximately 3 to 4 hours to complete; you may bring reading material. If someone comes with you to your appointment, they will need to remain in the main lobby due to limited space in the testing area.   If you are pregnant or breastfeeding, please notify the nuclear lab prior to your appointment.   How to prepare for your Myocardial Perfusion test:   Do not eat or drink 3 hours prior to your test, except you may have water.    Do not consume products containing caffeine (regular or decaffeinated) 12 hours prior to your test (ex: coffee, chocolate, soda, tea)   Do bring a list of your current medications  with you. If not listed below, you may take your medications as normal.    Bring any held medication to your appointment, as you may be required to take it once the test is complete.   Do wear comfortable clothes (no dresses or overalls) and walking shoes. Tennis shoes are preferred. No heels or open toed shoes.  Do not wear cologne, perfume, aftershave or lotions (deodorant is allowed).   If these instructions are not followed, you test will have to be rescheduled.   Please report to [address] for your test. If you have questions or concerns about your appointment, please call [phone].   If you cannot keep your appointment, please provide 24 hour notification to the Nuclear lab to avoid a possible $50 charge to your account.        Follow-Up: At [facility], you and your health needs are our priority.  As part of our continuing mission to provide you with exceptional heart care, we have created designated Provider Care Teams.  These Care Teams include your primary Cardiologist (physician) and Advanced Practice Providers (APPs -  Physician Assistants and Nurse Practitioners) who all work together to provide you with the care you need, when you need it.  We recommend signing up for the patient portal called "MyChart".  Sign up information is provided on this After Visit Summary.  MyChart is used to  connect with patients for Virtual Visits (Telemedicine).  Patients are able to view lab/test results, encounter notes, upcoming appointments, etc.  Non-urgent messages can be sent to your provider as well.   To learn more about what you can do with MyChart, go to [email or web address].    Your next appointment:   3 month(s)  The format for your next appointment:   In Person  Provider:   Belva Crome, MD   Other Instructions

## 2020-06-12 NOTE — Progress Notes (Signed)
Cardiology Office Note:    Date:  06/12/2020   ID:  Lydia Mitchell, DOB [id number], MRN [id number]  PCP:  Nonnie Done., MD  Cardiologist:  Garwin Brothers, MD   Referring MD: Nonnie Done., MD    ASSESSMENT:    1. Cigarette smoker   2. Chest tightness   3. Essential hypertension   4. Mixed dyslipidemia   5. Dyspnea on exertion    PLAN:    In order of problems listed above:  1. Primary prevention stressed to the patient.  Importance of compliance with diet medication stressed and she vocalized understanding.  Importance of regular exercise stressed but I told her that we would do the stress test first before she starts an exercise program and she understands. 2. Chest tightness: Patient has multiple risk factors for coronary artery disease and therefore will undergo Lexiscan sestamibi to evaluate for any obstructive evidence of coronary artery disease. 3. Essential hypertension: Blood pressure stable and diet was emphasized 4. Mixed dyslipidemia: Diet was emphasized.  She is on statin therapy.  Disease followed by her primary care physician. 5. Cigarette smoker: I spent 5 minutes with the patient discussing solely about smoking. Smoking cessation was counseled. I suggested to the patient also different medications and pharmacological interventions. Patient is keen to try stopping on its own at this time. He will get back to me if he needs any further assistance in this matter. 6. Sublingual nitroglycerin prescription was sent, its protocol and 911 protocol explained and the patient vocalized understanding questions were answered to the patient's satisfaction 7. She will be seen in follow-up appointment in 2 months or earlier if she has any concerns.  She knows to go to the nearest emergency room for any significant issues.   Medication Adjustments/Labs and Tests Ordered: Current medicines are reviewed at length with the patient today.  Concerns regarding medicines are outlined  above.  No orders of the defined types were placed in this encounter.  No orders of the defined types were placed in this encounter.    History of Present Illness:    Lydia Mitchell is a 59 y.o. female who is being seen today for the evaluation of chest tightness at the request of [place], Excell Seltzer., MD.  Patient is a pleasant 59 year old female.  She was in [place] hospital recently and I reviewed RECORDS.  She mentions to me that she occasionally has chest tightness.  This may or may not be occurring with exertion.  No chest pain orthopnea or PND.  She walks at [facility] [employer] where she works without any significant problems.  No radiation to the neck or the arms.  She was recently initiated on statin therapy for hyperlipidemia.  She has history of essential hypertension and unfortunately smokes.  At the time of my evaluation, the patient is alert awake oriented and in no distress.  Past Medical History:  Diagnosis Date  . Acid reflux   . Anemia   . Anxiety   . Cardiomegaly 01/03/2018  . Cephalgia    Chronic recurrent  . Chest pain 01/06/2018  . Cigarette smoker 01/06/2018   Spirometry s obst 01/07/2018   . Diverticulosis   . Dyspnea on exertion 01/07/2018   Spirometry 01/07/2018  FEV1 1.85 (68%)  Ratio 83 w/o curvature on no rx    . Hyperlipidemia   . Internal hemorrhoids   . Pleural effusion 01/03/2018  . Solitary pulmonary nodule on lung CT 01/19/2018   CT abd 12/28/17 c/w 3 mm RML  nodule > in computer for recall 12/29/18     Past Surgical History:  Procedure Laterality Date  . COLONOSCOPY W/ POLYPECTOMY    . TONSILLECTOMY      Current Medications: Current Meds  Medication Sig  . acetaminophen (TYLENOL) 650 MG CR tablet Take 650 mg by mouth every 8 (eight) hours as needed for pain.  Marland Kitchen ALPRAZolam (XANAX) 0.25 MG tablet Take 1 tablet by mouth as needed.   Marland Kitchen atorvastatin (LIPITOR) 10 MG tablet Take 10 mg by mouth daily.     Allergies:   Patient has no known allergies.   Social History    Socioeconomic History  . Marital status: Unknown    Spouse name: Not on file  . Number of children: Not on file  . Years of education: Not on file  . Highest education level: Not on file  Occupational History  . Not on file  Tobacco Use  . Smoking status: Current Every Day Smoker    Packs/day: 1.00    Years: 20.00    Pack years: 20.00    Types: Cigarettes  . Smokeless tobacco: Never Used  Vaping Use  . Vaping Use: Never used  Substance and Sexual Activity  . Alcohol use: Yes    Alcohol/week: 2.0 standard drinks    Types: 2 Glasses of wine per week  . Drug use: Never  . Sexual activity: Not on file  Other Topics Concern  . Not on file  Social History Narrative  . Not on file   Social Determinants of Health   Financial Resource Strain:   . Difficulty of Paying Living Expenses: Not on file  Food Insecurity:   . Worried About Programme researcher, broadcasting/film/video in the Last Year: Not on file  . Ran Out of Food in the Last Year: Not on file  Transportation Needs:   . Lack of Transportation (Medical): Not on file  . Lack of Transportation (Non-Medical): Not on file  Physical Activity:   . Days of Exercise per Week: Not on file  . Minutes of Exercise per Session: Not on file  Stress:   . Feeling of Stress : Not on file  Social Connections:   . Frequency of Communication with Friends and Family: Not on file  . Frequency of Social Gatherings with Friends and Family: Not on file  . Attends Religious Services: Not on file  . Active Member of Clubs or Organizations: Not on file  . Attends Banker Meetings: Not on file  . Marital Status: Not on file     Family History: The patient's family history includes Alzheimer's disease in her mother; Heart disease in her father; Lung cancer in her brother.  ROS:   Please see the history of present illness.    All other systems reviewed and are negative.  EKGs/Labs/Other Studies Reviewed:    The following studies were reviewed  today: EKG reveals sinus rhythm and nonspecific ST-T changes.  Echocardiogram was unremarkable.  I reviewed those records.   Recent Labs: No results found for requested labs within last 8760 hours.  Recent Lipid Panel No results found for: CHOL, TRIG, HDL, CHOLHDL, VLDL, LDLCALC, LDLDIRECT  Physical Exam:    VS:  BP 118/78   Pulse 68   Ht 5\' 5"  (1.651 m)   Wt 189 lb 6.4 oz (85.9 kg)   SpO2 99%   BMI 31.52 kg/m     Wt Readings from Last 3 Encounters:  06/12/20 189 lb 6.4 oz (85.9 kg)  05/22/20 190 lb (86.2 kg)  01/19/18 170 lb 9.6 oz (77.4 kg)     GEN: Patient is in no acute distress HEENT: Normal NECK: No JVD; No carotid bruits LYMPHATICS: No lymphadenopathy CARDIAC: S1 S2 regular, 2/6 systolic murmur at the apex. RESPIRATORY:  Clear to auscultation without rales, wheezing or rhonchi  ABDOMEN: Soft, non-tender, non-distended MUSCULOSKELETAL:  No edema; No deformity  SKIN: Warm and dry NEUROLOGIC:  Alert and oriented x 3 PSYCHIATRIC:  Normal affect    Signed, Garwin Brothers, MD  06/12/2020 8:55 AM    [facility]

## 2020-06-18 ENCOUNTER — Telehealth: Payer: Self-pay | Admitting: *Deleted

## 2020-06-18 NOTE — Telephone Encounter (Signed)
Left message on voicemail per DPR in reference to upcoming appointment scheduled on 06/25/2020 at 0745 with detailed instructions given per Myocardial Perfusion Study Information Sheet for the test. LM to arrive 15 minutes early, and that it is imperative to arrive on time for appointment to keep from having the test rescheduled. If you need to cancel or reschedule your appointment, please call the office within 24 hours of your appointment. Failure to do so may result in a cancellation of your appointment, and a $50 no show fee. Phone number given for call back for any questions.  No mychart available.Lydia Mitchell, Adelene Idler

## 2020-06-25 ENCOUNTER — Ambulatory Visit (INDEPENDENT_AMBULATORY_CARE_PROVIDER_SITE_OTHER): Payer: BC Managed Care – PPO

## 2020-06-25 ENCOUNTER — Other Ambulatory Visit: Payer: Self-pay

## 2020-06-25 DIAGNOSIS — R0600 Dyspnea, unspecified: Secondary | ICD-10-CM

## 2020-06-25 DIAGNOSIS — F17210 Nicotine dependence, cigarettes, uncomplicated: Secondary | ICD-10-CM | POA: Diagnosis not present

## 2020-06-25 DIAGNOSIS — I10 Essential (primary) hypertension: Secondary | ICD-10-CM | POA: Diagnosis not present

## 2020-06-25 DIAGNOSIS — E782 Mixed hyperlipidemia: Secondary | ICD-10-CM

## 2020-06-25 DIAGNOSIS — R0789 Other chest pain: Secondary | ICD-10-CM | POA: Diagnosis not present

## 2020-06-25 DIAGNOSIS — R0609 Other forms of dyspnea: Secondary | ICD-10-CM

## 2020-06-25 LAB — MYOCARDIAL PERFUSION IMAGING
LV dias vol: 68 mL (ref 46–106)
LV sys vol: 16 mL
Peak HR: 93 bpm
Rest HR: 68 bpm
SDS: 2
SRS: 5
SSS: 7
TID: 0.94

## 2020-06-25 MED ORDER — TECHNETIUM TC 99M TETROFOSMIN IV KIT
32.7000 | Freq: Once | INTRAVENOUS | Status: AC | PRN
Start: 2020-06-25 — End: 2020-06-25

## 2020-06-25 MED ORDER — REGADENOSON 0.4 MG/5ML IV SOLN
0.4000 mg | Freq: Once | INTRAVENOUS | Status: AC
Start: 2020-06-25 — End: 2020-06-25

## 2020-06-25 MED ORDER — TECHNETIUM TC 99M TETROFOSMIN IV KIT
10.8000 | Freq: Once | INTRAVENOUS | Status: AC | PRN
Start: 2020-06-25 — End: 2020-06-25

## 2020-06-26 ENCOUNTER — Telehealth: Payer: Self-pay | Admitting: Cardiology

## 2020-06-26 NOTE — Telephone Encounter (Signed)
I spoke with patient and reviewed Steffanie Dunn results with her.

## 2020-06-26 NOTE — Telephone Encounter (Signed)
Pt calling to ask about the results from her Myocardial Perfusion test yesterday. Please call to discuss results

## 2020-09-11 DIAGNOSIS — E785 Hyperlipidemia, unspecified: Secondary | ICD-10-CM | POA: Insufficient documentation

## 2020-09-12 ENCOUNTER — Ambulatory Visit: Payer: BC Managed Care – PPO | Admitting: Cardiology

## 2022-05-31 DIAGNOSIS — I4510 Unspecified right bundle-branch block: Secondary | ICD-10-CM | POA: Diagnosis not present

## 2022-06-01 DIAGNOSIS — R079 Chest pain, unspecified: Secondary | ICD-10-CM | POA: Diagnosis not present
